# Patient Record
Sex: Male | Born: 1945 | Hispanic: No
Health system: Southern US, Community
[De-identification: ages and names within clinical notes are randomized; demographics above are authoritative.]

---

## 2015-06-16 ENCOUNTER — Other Ambulatory Visit (HOSPITAL_COMMUNITY): Payer: Self-pay

## 2015-06-16 ENCOUNTER — Inpatient Hospital Stay
Admission: AD | Admit: 2015-06-16 | Discharge: 2015-07-05 | Disposition: A | Discharge status: Skilled Nursing Facility | Payer: Self-pay | Source: Ambulatory Visit | Attending: Internal Medicine | Admitting: Internal Medicine

## 2015-06-16 DIAGNOSIS — Z4659 Encounter for fitting and adjustment of other gastrointestinal appliance and device: Secondary | ICD-10-CM

## 2015-06-16 DIAGNOSIS — R1310 Dysphagia, unspecified: Secondary | ICD-10-CM

## 2015-06-16 DIAGNOSIS — Z431 Encounter for attention to gastrostomy: Secondary | ICD-10-CM

## 2015-06-16 DIAGNOSIS — J9690 Respiratory failure, unspecified, unspecified whether with hypoxia or hypercapnia: Secondary | ICD-10-CM

## 2015-06-17 ENCOUNTER — Other Ambulatory Visit (HOSPITAL_COMMUNITY): Payer: Self-pay

## 2015-06-17 LAB — COMPREHENSIVE METABOLIC PANEL
ALBUMIN: 2.4 g/dL — AB (ref 3.5–5.0)
ALT: 31 U/L (ref 17–63)
ANION GAP: 10 (ref 5–15)
AST: 23 U/L (ref 15–41)
Alkaline Phosphatase: 53 U/L (ref 38–126)
BILIRUBIN TOTAL: 1.2 mg/dL (ref 0.3–1.2)
BUN: 22 mg/dL — ABNORMAL HIGH (ref 6–20)
CO2: 29 mmol/L (ref 22–32)
Calcium: 8.6 mg/dL — ABNORMAL LOW (ref 8.9–10.3)
Chloride: 103 mmol/L (ref 101–111)
Creatinine, Ser: 0.65 mg/dL (ref 0.61–1.24)
GFR calc Af Amer: >60 mL/min (ref >60)
GFR calc non Af Amer: >60 mL/min (ref >60)
GLUCOSE: 69 mg/dL (ref 65–99)
POTASSIUM: 3.7 mmol/L (ref 3.5–5.1)
SODIUM: 142 mmol/L (ref 135–145)
TOTAL PROTEIN: 4.5 g/dL — AB (ref 6.5–8.1)

## 2015-06-17 LAB — C-REACTIVE PROTEIN: CRP: <0.5 mg/dL (ref <1.0)

## 2015-06-17 LAB — CBC
HCT: 38.2 % — ABNORMAL LOW (ref 39.0–52.0)
Hemoglobin: 12.5 g/dL — ABNORMAL LOW (ref 13.0–17.0)
MCH: 29.3 pg (ref 26.0–34.0)
MCHC: 32.7 g/dL (ref 30.0–36.0)
MCV: 89.7 fL (ref 78.0–100.0)
Platelets: 219 10*3/uL (ref 150–400)
RBC: 4.26 MIL/uL (ref 4.22–5.81)
RDW: 13.8 % (ref 11.5–15.5)
WBC: 20.2 10*3/uL — ABNORMAL HIGH (ref 4.0–10.5)

## 2015-06-17 LAB — MAGNESIUM: Magnesium: 2.1 mg/dL (ref 1.7–2.4)

## 2015-06-18 LAB — BASIC METABOLIC PANEL
Anion gap: 12 (ref 5–15)
BUN: 21 mg/dL — ABNORMAL HIGH (ref 6–20)
CHLORIDE: 98 mmol/L — AB (ref 101–111)
CO2: 33 mmol/L — ABNORMAL HIGH (ref 22–32)
CREATININE: 0.43 mg/dL — AB (ref 0.61–1.24)
Calcium: 8.5 mg/dL — ABNORMAL LOW (ref 8.9–10.3)
GFR calc non Af Amer: >60 mL/min (ref >60)
Glucose, Bld: 107 mg/dL — ABNORMAL HIGH (ref 65–99)
Potassium: 4 mmol/L (ref 3.5–5.1)
SODIUM: 143 mmol/L (ref 135–145)

## 2015-06-19 LAB — BASIC METABOLIC PANEL
ANION GAP: 6 (ref 5–15)
BUN: 18 mg/dL (ref 6–20)
CALCIUM: 8.5 mg/dL — AB (ref 8.9–10.3)
CO2: 33 mmol/L — AB (ref 22–32)
CREATININE: 0.5 mg/dL — AB (ref 0.61–1.24)
Chloride: 100 mmol/L — ABNORMAL LOW (ref 101–111)
GFR calc Af Amer: >60 mL/min (ref >60)
GLUCOSE: 135 mg/dL — AB (ref 65–99)
Potassium: 4 mmol/L (ref 3.5–5.1)
Sodium: 139 mmol/L (ref 135–145)

## 2015-06-19 LAB — CBC WITH DIFFERENTIAL/PLATELET
BASOS ABS: 0 10*3/uL (ref 0.0–0.1)
Basophils Relative: 0 %
EOS PCT: 0 %
Eosinophils Absolute: 0 10*3/uL (ref 0.0–0.7)
HEMATOCRIT: 39.2 % (ref 39.0–52.0)
Hemoglobin: 12.9 g/dL — ABNORMAL LOW (ref 13.0–17.0)
LYMPHS PCT: 3 %
Lymphs Abs: 0.6 10*3/uL — ABNORMAL LOW (ref 0.7–4.0)
MCH: 29.6 pg (ref 26.0–34.0)
MCHC: 32.9 g/dL (ref 30.0–36.0)
MCV: 89.9 fL (ref 78.0–100.0)
MONO ABS: 0.4 10*3/uL (ref 0.1–1.0)
MONOS PCT: 2 %
NEUTROS ABS: 18.3 10*3/uL — AB (ref 1.7–7.7)
Neutrophils Relative %: 95 %
PLATELETS: 177 10*3/uL (ref 150–400)
RBC: 4.36 MIL/uL (ref 4.22–5.81)
RDW: 13.6 % (ref 11.5–15.5)
WBC: 19.3 10*3/uL — ABNORMAL HIGH (ref 4.0–10.5)

## 2015-06-20 LAB — PROTIME-INR
INR: 1.15 (ref 0.00–1.49)
PROTHROMBIN TIME: 14.9 s (ref 11.6–15.2)

## 2015-06-20 LAB — APTT: aPTT: 29 seconds (ref 24–37)

## 2015-06-20 LAB — PROCALCITONIN: Procalcitonin: <0.1 ng/mL

## 2015-06-21 LAB — PROCALCITONIN

## 2015-06-22 ENCOUNTER — Other Ambulatory Visit (HOSPITAL_COMMUNITY): Payer: Self-pay

## 2015-06-22 NOTE — Consult Note (Signed)
Chief Complaint: Patient was seen in consultation today for percutaneous gastric tube placement at the request of Dr Carron CurieAli Hijazi  Referring Physician(s): Dr Carron CurieAli Hijazi  Supervising Physician: Richarda OverlieHenn, Adam  History of Present Illness: Roy Moreno is a 70 y.o. male   Dementia Hx non Stemi----no anticoagulation COPD Resp failure B PNA---intubated Transferred [facility] Extubated and now with dysphagia Protein calorie malnutrition Need for long term care Request made for percutaneous gastric tube placement Dr Lowella DandyHenn has reviewed imaging and approves procedure  High wbc---on Prednisone afeb Lovenox Held   No past medical history on file.  No past surgical history on file.  Allergies: Review of patient's allergies indicates not on file.  Medications: Prior to Admission medications   Not on File     No family history on file.  Social History   Social History  . Marital Status: Widowed    Spouse Name: N/A  . Number of Children: N/A  . Years of Education: N/A   Social History Main Topics  . Smoking status: Not on file  . Smokeless tobacco: Not on file  . Alcohol Use: Not on file  . Drug Use: Not on file  . Sexual Activity: Not on file   Other Topics Concern  . Not on file   Social History Narrative  . No narrative on file     Review of Systems: A 12 point ROS discussed and pertinent positives are indicated in the HPI above.  All other systems are negative.  Review of Systems  Constitutional: Negative for fever.  Neurological: Positive for weakness.  Psychiatric/Behavioral: Positive for behavioral problems and confusion.    Vital Signs: There were no vitals taken for this visit.  Physical Exam  Cardiovascular: Normal rate and regular rhythm.   Pulmonary/Chest: He has wheezes.  Abdominal: Soft. Bowel sounds are normal.  Neurological: He is alert.  Skin: Skin is warm and dry.  Psychiatric:  Consented with sister via phone  Nursing  note and vitals reviewed.   Mallampati Score:  MD Evaluation Airway: WNL Heart: WNL Abdomen: WNL Chest/ Lungs: WNL ASA  Classification: 3 Mallampati/Airway Score: Two  Imaging: Ct Abdomen Wo Contrast  06/22/2015  CLINICAL DATA:  70 year old with dysphasia. Evaluate anatomy for percutaneous gastrostomy tube placement. EXAM: CT ABDOMEN WITHOUT CONTRAST TECHNIQUE: Multidetector CT imaging of the abdomen was performed following the standard protocol without IV contrast. COMPARISON:  Abdominal radiograph 06/17/2015 FINDINGS: Lower chest: Interstitial thickening and parenchymal densities in the posterior left lower lobe. Findings are concerning for a focus of aspiration or pneumonia. Patient has underlying centrilobular emphysema. There may be a trace amount of pleural fluid bilaterally. Hepatobiliary: 6 mm low-density structure in the liver adjacent to the gallbladder. This is too small to definitely characterize but probably represents a cyst. No gross abnormality to the liver or gallbladder. No significant biliary dilatation. Pancreas: Normal appearance of the pancreas without inflammation or duct dilatation. Spleen: Normal appearance of the spleen without enlargement. Evidence for at least 3 small accessory spleens. Adrenals/Urinary Tract: Normal adrenal glands. 7.0 cm low-density structure involving the right kidney upper pole is most compatible with a large cyst. Probable cyst in the left kidney lower pole measuring 2.3 cm. Negative for hydronephrosis. Stomach/Bowel: Stomach has a normal position and configuration. Nasogastric tube terminates in the stomach body region. Normal appearance of the duodenum and visualized small bowel. There is oral contrast within the colon and the transverse colon is well below the stomach. Vascular/Lymphatic: Atherosclerotic calcifications involving the  abdominal aorta without aneurysm. There is no significant abdominal lymphadenopathy. Other: No evidence for ascites or  free air. Surgical changes along the right anterior abdominal wall. Musculoskeletal: No suspicious bone findings. IMPRESSION: The patient's anatomy is suitable for a percutaneous gastrostomy tube placement. Consolidation and parenchymal disease in the posterior left lower lobe. Findings are suspicious for an area of aspiration or pneumonia. Underlying emphysema. Evidence for bilateral renal cysts. Electronically Signed   By: Richarda Overlie M.D.   On: 06/22/2015 11:07   Dg Chest Port 1 View  06/17/2015  CLINICAL DATA:  Respiratory failure EXAM: PORTABLE CHEST 1 VIEW COMPARISON:  None. FINDINGS: Left rotated chest radiograph. Enteric tube enters the stomach, with the tip not seen on this image. Right internal jugular central venous catheter terminates in the middle third of the superior vena cava . Top-normal heart size. Atherosclerotic aortic arch. Otherwise grossly normal mediastinal contour. No pneumothorax. Slight blunting of both costophrenic angles. Patchy left lower lobe lung opacities. IMPRESSION: 1. Limited rotated chest radiograph. Well-positioned support structures as described . 2. Slight blunting of both costophrenic angles, representing either pleural-parenchymal scarring or trace pleural effusions. 3. Mild patchy left lower lobe lung opacities, favor atelectasis, cannot exclude a component of pneumonia. Electronically Signed   By: Delbert Phenix M.D.   On: 06/17/2015 10:36   Dg Abd Portable 1v  06/17/2015  CLINICAL DATA:  Enteric tube placement.  Initial encounter. EXAM: PORTABLE ABDOMEN - 1 VIEW COMPARISON:  Abdominal radiograph performed 06/16/2015 FINDINGS: The patient's enteric tube is noted ending overlying the body of the stomach. The side port is noted about the fundus of the stomach. Contrast is noted within the colon. The stomach contains a small amount of air. No free intra-abdominal air is seen, though evaluation for free air is limited on a single supine view. No acute osseous abnormalities  are identified. IMPRESSION: Enteric tube noted ending overlying the body of the stomach. Electronically Signed   By: Roanna Raider M.D.   On: 06/17/2015 03:17   Dg Abd Portable 1v  06/16/2015  CLINICAL DATA:  Enteric tube placement EXAM: PORTABLE ABDOMEN - 1 VIEW COMPARISON:  None. FINDINGS: Weighted enteric tube tip is in the body of the stomach. No disproportionately dilated small bowel loops in the visualized abdomen. Retained oral contrast is present throughout the visualized colon. No gross evidence of pneumatosis or pneumoperitoneum. Mild right basilar lung opacity. IMPRESSION: Weighted enteric tube tip is in the body of the stomach. Nonobstructive bowel gas pattern. Mild right basilar lung opacity, correlate with chest radiograph as clinically warranted. Electronically Signed   By: Delbert Phenix M.D.   On: 06/16/2015 16:32    Labs:  CBC:  Recent Labs  06/17/15 0535 06/19/15 1035  WBC 20.2* 19.3*  HGB 12.5* 12.9*  HCT 38.2* 39.2  PLT 219 177    COAGS:  Recent Labs  06/20/15 1750  INR 1.15  APTT 29    BMP:  Recent Labs  06/17/15 0535 06/18/15 0530 06/19/15 1035  NA 142 143 139  K 3.7 4.0 4.0  CL 103 98* 100*  CO2 29 33* 33*  GLUCOSE 69 107* 135*  BUN 22* 21* 18  CALCIUM 8.6* 8.5* 8.5*  CREATININE 0.65 0.43* 0.50*  GFRNONAA >60 >60 >60  GFRAA >60 >60 >60    LIVER FUNCTION TESTS:  Recent Labs  06/17/15 0535  BILITOT 1.2  AST 23  ALT 31  ALKPHOS 53  PROT 4.5*  ALBUMIN 2.4*    TUMOR MARKERS: No results  for input(s): AFPTM, CEA, CA199, CHROMGRNA in the last 8760 hours.  Assessment and Plan:  Dysphagia PCM Dementia Scheduled for percutaneous gastric tube placement Risks and Benefits discussed with the patient's sister including, but not limited to the need for a barium enema during the procedure, bleeding, infection, peritonitis, or damage to adjacent structures. All of her questions were answered, she is agreeable to proceed. Consent signed and  in chart.   Thank you for this interesting consult.  I greatly enjoyed meeting Lyric Hoar and look forward to participating in their care.  A copy of this report was sent to the requesting provider on this date.  Electronically Signed: Ralene Muskrat A 06/22/2015, 2:41 PM   I spent a total of 40 Minutes    in face to face in clinical consultation, greater than 50% of which was counseling/coordinating care for percutaneous gastric tube placment

## 2015-06-23 LAB — BASIC METABOLIC PANEL
Anion gap: 6 (ref 5–15)
BUN: 31 mg/dL — ABNORMAL HIGH (ref 6–20)
CHLORIDE: 101 mmol/L (ref 101–111)
CO2: 33 mmol/L — ABNORMAL HIGH (ref 22–32)
Calcium: 9 mg/dL (ref 8.9–10.3)
Glucose, Bld: 95 mg/dL (ref 65–99)
POTASSIUM: 4.1 mmol/L (ref 3.5–5.1)
SODIUM: 140 mmol/L (ref 135–145)

## 2015-06-23 LAB — CBC
HCT: 37.5 % — ABNORMAL LOW (ref 39.0–52.0)
HEMOGLOBIN: 11.9 g/dL — AB (ref 13.0–17.0)
MCH: 29.2 pg (ref 26.0–34.0)
MCHC: 31.7 g/dL (ref 30.0–36.0)
MCV: 92.1 fL (ref 78.0–100.0)
Platelets: 300 10*3/uL (ref 150–400)
RBC: 4.07 MIL/uL — AB (ref 4.22–5.81)
RDW: 14.1 % (ref 11.5–15.5)
WBC: 5.9 10*3/uL (ref 4.0–10.5)

## 2015-06-26 ENCOUNTER — Other Ambulatory Visit (HOSPITAL_COMMUNITY): Payer: Self-pay

## 2015-06-26 MED ORDER — FENTANYL CITRATE (PF) 100 MCG/2ML IJ SOLN
INTRAMUSCULAR | Status: AC
  Filled 2015-06-26: qty 2

## 2015-06-26 MED ORDER — CEFAZOLIN SODIUM-DEXTROSE 2-3 GM-% IV SOLR
INTRAVENOUS | Status: AC
  Filled 2015-06-26: qty 50

## 2015-06-26 MED ORDER — MIDAZOLAM HCL 2 MG/2ML IJ SOLN
INTRAMUSCULAR | Status: AC
  Filled 2015-06-26: qty 2

## 2015-06-26 MED ORDER — LIDOCAINE HCL 1 % IJ SOLN
INTRAMUSCULAR | Status: AC
  Filled 2015-06-26: qty 20

## 2015-06-26 MED ORDER — FENTANYL CITRATE (PF) 100 MCG/2ML IJ SOLN
INTRAMUSCULAR | Status: AC | PRN
  Administered 2015-06-26: 50 ug via INTRAVENOUS

## 2015-06-26 MED ORDER — IOHEXOL 300 MG/ML  SOLN
50.0000 mL | Freq: Once | INTRAMUSCULAR | Status: AC | PRN
  Administered 2015-06-26: 50 mL via INTRAVENOUS

## 2015-06-26 MED ORDER — MIDAZOLAM HCL 2 MG/2ML IJ SOLN
INTRAMUSCULAR | Status: AC | PRN
  Administered 2015-06-26: 1 mg via INTRAVENOUS

## 2015-06-26 MED ORDER — GLUCAGON HCL RDNA (DIAGNOSTIC) 1 MG IJ SOLR
INTRAMUSCULAR | Status: AC
  Filled 2015-06-26: qty 1

## 2015-06-26 NOTE — Procedures (Signed)
Interventional Radiology Procedure Note  Procedure:  Gastrostomy tube placement  Complications:  None  Estimated Blood Loss: < 10 mL  20 Fr bumper retention g-tube placed with tip in body of stomach.  OK to use tomorrow.  Jodi MarbleGlenn T. Fredia SorrowYamagata, M.D Pager:  [phone]

## 2015-06-27 LAB — CBC
HCT: 41.5 % (ref 39.0–52.0)
Hemoglobin: 13.7 g/dL (ref 13.0–17.0)
MCH: 29.9 pg (ref 26.0–34.0)
MCHC: 33 g/dL (ref 30.0–36.0)
MCV: 90.6 fL (ref 78.0–100.0)
PLATELETS: 223 10*3/uL (ref 150–400)
RBC: 4.58 MIL/uL (ref 4.22–5.81)
RDW: 14.1 % (ref 11.5–15.5)
WBC: 13.9 10*3/uL — AB (ref 4.0–10.5)

## 2015-06-29 LAB — CBC
HCT: 37.5 % — ABNORMAL LOW (ref 39.0–52.0)
HEMOGLOBIN: 12.5 g/dL — AB (ref 13.0–17.0)
MCH: 29.8 pg (ref 26.0–34.0)
MCHC: 33.3 g/dL (ref 30.0–36.0)
MCV: 89.5 fL (ref 78.0–100.0)
Platelets: 236 10*3/uL (ref 150–400)
RBC: 4.19 MIL/uL — AB (ref 4.22–5.81)
RDW: 14.3 % (ref 11.5–15.5)
WBC: 16.3 10*3/uL — AB (ref 4.0–10.5)

## 2015-06-29 LAB — PROCALCITONIN

## 2015-06-30 ENCOUNTER — Other Ambulatory Visit (HOSPITAL_COMMUNITY): Payer: Self-pay

## 2015-06-30 LAB — BRAIN NATRIURETIC PEPTIDE: B Natriuretic Peptide: 121.2 pg/mL — ABNORMAL HIGH (ref 0.0–100.0)

## 2015-07-03 LAB — CBC WITH DIFFERENTIAL/PLATELET
BASOS ABS: 0 10*3/uL (ref 0.0–0.1)
BASOS PCT: 0 %
EOS PCT: 1 %
Eosinophils Absolute: 0.1 10*3/uL (ref 0.0–0.7)
HCT: 35.2 % — ABNORMAL LOW (ref 39.0–52.0)
Hemoglobin: 11.9 g/dL — ABNORMAL LOW (ref 13.0–17.0)
LYMPHS ABS: 2.1 10*3/uL (ref 0.7–4.0)
Lymphocytes Relative: 13 %
MCH: 30.7 pg (ref 26.0–34.0)
MCHC: 33.8 g/dL (ref 30.0–36.0)
MCV: 91 fL (ref 78.0–100.0)
MONO ABS: 0.9 10*3/uL (ref 0.1–1.0)
MONOS PCT: 6 %
Neutro Abs: 12.8 10*3/uL — ABNORMAL HIGH (ref 1.7–7.7)
Neutrophils Relative %: 80 %
PLATELETS: 230 10*3/uL (ref 150–400)
RBC: 3.87 MIL/uL — ABNORMAL LOW (ref 4.22–5.81)
RDW: 14.5 % (ref 11.5–15.5)
WBC: 16 10*3/uL — ABNORMAL HIGH (ref 4.0–10.5)

## 2015-07-03 LAB — BASIC METABOLIC PANEL
ANION GAP: 9 (ref 5–15)
BUN: 17 mg/dL (ref 6–20)
CALCIUM: 8.9 mg/dL (ref 8.9–10.3)
CO2: 30 mmol/L (ref 22–32)
Chloride: 98 mmol/L — ABNORMAL LOW (ref 101–111)
Creatinine, Ser: 0.54 mg/dL — ABNORMAL LOW (ref 0.61–1.24)
GLUCOSE: 101 mg/dL — AB (ref 65–99)
Potassium: 4.3 mmol/L (ref 3.5–5.1)
Sodium: 137 mmol/L (ref 135–145)

## 2017-01-23 IMAGING — XA IR PERC PLACEMENT GASTROSTOMY
1 series · 1 of 1 positions shown · non-contrast
Comparison: none

CLINICAL DATA: Respiratory failure and need for gastrostomy tube
for nutrition.

[Series 4: fl (-) angio · 1 of 1 slices shown]
[im 1/1]
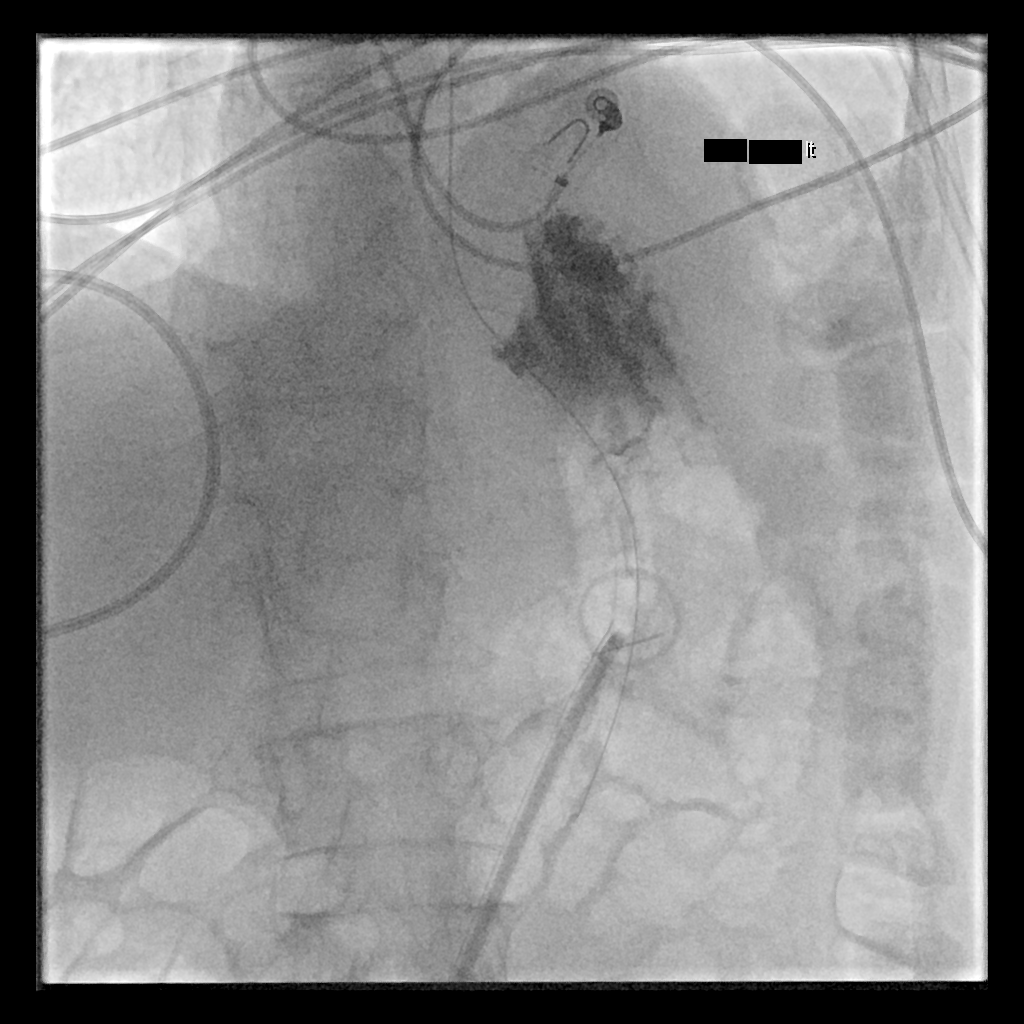

[1 of 1 positions shown; findings below may reference images not displayed]

EXAM:
PERCUTANEOUS GASTROSTOMY TUBE PLACEMENT

ANESTHESIA/SEDATION:
1.0 mg IV Versed; 50 mcg IV Fentanyl.

Total Moderate Sedation Time

10 minutes.

The patient's level of consciousness and physiologic status were
continuously monitored during the procedure by Radiology nursing.

CONTRAST:  50mL OMNIPAQUE IOHEXOL 300 MG/ML  SOLN

MEDICATIONS:
2 g IV Ancef. As antibiotic prophylaxis, Ancef was ordered
pre-procedure and administered intravenously within one hour of
incision.

FLUOROSCOPY TIME:  2 minutes, 6 seconds.

PROCEDURE:
The procedure, risks, benefits, and alternatives were explained to
the patient. Questions regarding the procedure were encouraged and
answered. The patient understands and consents to the procedure. A
time-out was performed prior to the procedure.

A 5-French catheter was then advanced through the the patient's
mouth under fluoroscopy into the esophagus and to the level of the
stomach. This catheter was used to insufflate the stomach with air
under fluoroscopy.

The abdominal wall was prepped with Betadine in a sterile fashion,
and a sterile drape was applied covering the operative field. A
sterile gown and sterile gloves were used for the procedure. Local
anesthesia was provided with 1% Lidocaine.

A skin incision was made in the upper abdominal wall. Under
fluoroscopy, an 18 gauge trocar needle was advanced into the
stomach. Contrast injection was performed to confirm intraluminal
position of the needle tip. A single T tack was then deployed in the
lumen of the stomach. This was brought up to tension at the skin
surface.

Over a guidewire, a 9-French sheath was advanced into the lumen of
the stomach. The wire was left in place as a safety wire. A loop
snare device from a percutaneous gastrostomy kit was then advanced
into the stomach.

A floppy guide wire was advanced through the orogastric catheter
under fluoroscopy in the stomach. The loop snare advanced through
the percutaneous gastric access was used to snare the guide wire.
This allowed withdrawal of the loop snare out of the patient's mouth
by retraction of the orogastric catheter and wire.

A 20-French bumper retention gastrostomy tube was looped around the
snare device. It was then pulled back through the patient's mouth.
The retention bumper was brought up to the anterior gastric wall.
The T tack suture was cut at the skin. The exiting gastrostomy tube
was cut to appropriate length and a feeding adapter applied. The
catheter was injected with contrast material to confirm position and
a fluoroscopic spot image saved. The tube was then flushed with
saline. A dressing was applied over the gastrostomy exit site.

COMPLICATIONS:
None.
FINDINGS: The stomach distended well with air allowing safe placement of the
gastrostomy tube. After placement, the tip of the gastrostomy tube
lies in the body of the stomach.
IMPRESSION: Percutaneous gastrostomy with placement of a 20-French bumper
retention tube in the body of the stomach. This tube can be used for
percutaneous feeds beginning in 24 hours after placement.

## 2017-07-30 DEATH — deceased
